# Patient Record
Sex: Female | Born: 1975 | Race: Black or African American | Hispanic: No | Smoking: Never smoker
Health system: Southern US, Community
[De-identification: ages and names within clinical notes are randomized; demographics above are authoritative.]

## PROBLEM LIST (undated history)

## (undated) DIAGNOSIS — E119 Type 2 diabetes mellitus without complications: Secondary | ICD-10-CM

## (undated) DIAGNOSIS — J45909 Unspecified asthma, uncomplicated: Secondary | ICD-10-CM

## (undated) DIAGNOSIS — N289 Disorder of kidney and ureter, unspecified: Secondary | ICD-10-CM

## (undated) DIAGNOSIS — I10 Essential (primary) hypertension: Secondary | ICD-10-CM

## (undated) HISTORY — PX: ABDOMINAL HYSTERECTOMY: SHX81

## (undated) HISTORY — PX: BREAST SURGERY: SHX581

---

## 2014-07-15 ENCOUNTER — Encounter (HOSPITAL_COMMUNITY): Payer: Self-pay | Admitting: *Deleted

## 2014-07-15 ENCOUNTER — Emergency Department (HOSPITAL_COMMUNITY)
Admission: EM | Admit: 2014-07-15 | Discharge: 2014-07-15 | Disposition: A | Discharge status: Home / Self Care | Payer: Self-pay | Attending: Emergency Medicine | Admitting: Emergency Medicine

## 2014-07-15 DIAGNOSIS — T23201A Burn of second degree of right hand, unspecified site, initial encounter: Secondary | ICD-10-CM

## 2014-07-15 DIAGNOSIS — Y9289 Other specified places as the place of occurrence of the external cause: Secondary | ICD-10-CM | POA: Insufficient documentation

## 2014-07-15 DIAGNOSIS — X102XXA Contact with fats and cooking oils, initial encounter: Secondary | ICD-10-CM | POA: Insufficient documentation

## 2014-07-15 DIAGNOSIS — I10 Essential (primary) hypertension: Secondary | ICD-10-CM | POA: Insufficient documentation

## 2014-07-15 DIAGNOSIS — Y9389 Activity, other specified: Secondary | ICD-10-CM | POA: Insufficient documentation

## 2014-07-15 DIAGNOSIS — Y998 Other external cause status: Secondary | ICD-10-CM | POA: Insufficient documentation

## 2014-07-15 DIAGNOSIS — Z79899 Other long term (current) drug therapy: Secondary | ICD-10-CM | POA: Insufficient documentation

## 2014-07-15 DIAGNOSIS — Z7982 Long term (current) use of aspirin: Secondary | ICD-10-CM | POA: Insufficient documentation

## 2014-07-15 DIAGNOSIS — T23261A Burn of second degree of back of right hand, initial encounter: Secondary | ICD-10-CM | POA: Insufficient documentation

## 2014-07-15 DIAGNOSIS — J45909 Unspecified asthma, uncomplicated: Secondary | ICD-10-CM | POA: Insufficient documentation

## 2014-07-15 HISTORY — DX: Unspecified asthma, uncomplicated: J45.909

## 2014-07-15 HISTORY — DX: Essential (primary) hypertension: I10

## 2014-07-15 MED ORDER — SILVER SULFADIAZINE 1 % EX CREA
TOPICAL_CREAM | CUTANEOUS | Status: AC
  Administered 2014-07-15: 1 via TOPICAL
  Filled 2014-07-15: qty 85

## 2014-07-15 MED ORDER — IBUPROFEN 800 MG PO TABS
800.0000 mg | ORAL_TABLET | Freq: Once | ORAL | Status: AC
  Administered 2014-07-15: 800 mg via ORAL
  Filled 2014-07-15: qty 1

## 2014-07-15 MED ORDER — HYDROCODONE-ACETAMINOPHEN 5-325 MG PO TABS: 1.0000 | ORAL_TABLET | ORAL | Status: DC | PRN
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
## 2014-07-15 NOTE — Discharge Instructions (Signed)
Please follow the directions provided. Use resource guide or the referral given to establish care with a primary care doctor to ensure you're getting better. Be sure to return in 2 days for wound recheck. Change your dressing daily using the Silvadene cream every day. Don't hesitate to return for any new, worsening, or concerning symptoms.   SEEK IMMEDIATE MEDICAL CARE IF:  You think your burn might be infected. It may change color, become red, leak fluid, swell, or smell bad.  You develop a fever of more than 102 F (38.9 C).   Emergency Department Resource Guide 1) Find a Doctor and Pay Out of Pocket Although you won't have to find out who is covered by your insurance plan, it is a good idea to ask around and get recommendations. You will then need to call the office and see if the doctor you have chosen will accept you as a new patient and what types of options they offer for patients who are self-pay. Some doctors offer discounts or will set up payment plans for their patients who do not have insurance, but you will need to ask so you aren't surprised when you get to your appointment.  2) Contact Your Local Health Department Not all health departments have doctors that can see patients for sick visits, but many do, so it is worth a call to see if yours does. If you don't know where your local health department is, you can check in your phone book. The CDC also has a tool to help you locate your state's health department, and many state websites also have listings of all of their local health departments.  3) Find a Walk-in Clinic If your illness is not likely to be very severe or complicated, you may want to try a walk in clinic. These are popping up all over the country in pharmacies, drugstores, and shopping centers. They're usually staffed by nurse practitioners or physician assistants that have been trained to treat common illnesses and complaints. They're usually fairly quick and  inexpensive. However, if you have serious medical issues or chronic medical problems, these are probably not your best option.  No Primary Care Doctor: - Call Health Connect at  [phone] - they can help you locate a primary care doctor that  accepts your insurance, provides certain services, etc. - Physician Referral Service- [phone]  Chronic Pain Problems: Organization         Address  Phone   Notes  [facility]  [phone] Patients need to be referred by their primary care doctor.   Medication Assistance: Organization         Address  Phone   Notes  [facility] Medication [facility] [address] [place], [place] [address] [phone] --Must be a resident of [place] [place] -- Must have NO insurance coverage whatsoever (no Medicaid/ Medicare, etc.) -- The pt. MUST have a primary care doctor that directs their care regularly and follows them in the community   MedAssist  [phone]   [employer]  [phone]    Agencies that provide inexpensive medical care: Organization         Address  Phone   Notes  [facility]  [phone]   [facility]    [phone]   [facility] [address] [place], [place] [address] [phone]   [facility] [address], [place] [phone]  Planned Parenthood    [phone]   [facility]    [phone]   Community [facility] and [address], [place] Phone:  [phone], Fax:  [phone] Hours of Operation:  9 am - 6 pm, M-F.  Also accepts Medicaid/Medicare and self-pay.  [facility] for [place], [address], [place] Phone: [phone], Fax: [phone]. Hours of Operation:  8:30 am - 5:30 pm, M-F.  Also accepts Medicaid and self-pay.  [facility] [address] [place] Phone: [phone]   [place], [place], [place] [phone], Ext. [phone] Mondays & Thursdays: 7-9 AM.  First 15 patients are seen on a first come, first serve basis.    [place] Providers:  Organization         Address  Phone   Notes  [facility] [address], [place] [phone] Also accepts self-pay patients.  [facility] [address], [place]  [phone]   [place], [address], [place] [phone]   [facility] [address] [place] [phone]   Lucianne Lei [address], [place]   [phone] Only accepts [place] Access [place] patients after they have their name applied to their card.   Self-Pay (no insurance) in [facility]:  Organization         Address  Phone   Notes  Sickle Cell Patients, [facility] [place] [phone]   [facility] [place] [phone]   [facility] [place]  [place], [place], [place] [phone]   [facility]/Dr. Osei-Bonsu  [address] [place] or [address], [place] [phone] Phone number for both [place] and [place] locations is the same.  [facility] [address] [place] [phone]   [facility] [address] [place] or [address] [phone]   [facility] [address], [place] [phone], phone; [phone], fax Sees patients 1st and 3rd Saturday of every month.  Must not qualify for public or private insurance (i.e. Medicaid, Medicare, [place] Health Choice, Veterans' Benefits)  Household income should be no more than 200% of the poverty level The clinic cannot treat you if you are pregnant or  think you are pregnant  Sexually transmitted diseases are not treated at the clinic.    Dental Care: Organization         Address  Phone  Notes  [facility] of [facility] [phone] Accepts children up to age 21 who are enrolled in [place] or [place]; pregnant women with a Medicaid card; and children who have applied for Medicaid or [place] Health Choice, but were declined, whose parents can pay a reduced fee at time of service.  [facility] Department of [facility]  [address], [place] [phone] Accepts children up to age 38 who are enrolled in [place] or [place]; pregnant women with a Medicaid card; and children who have applied for Medicaid or [place], but were declined,  whose parents can pay a reduced fee at time of service.  [place] Adult [facility] Access PROGRAM  [place] [phone] Patients are seen by appointment only. Walk-ins are not accepted. [place] will see patients 41 years of age and older. Monday - Tuesday (8am-5pm) Most Wednesdays (8:30-5pm) $30 per visit, cash only  [facility] Access PROGRAM  [address], [facility] [phone] Patients are seen by appointment only. Walk-ins are not accepted. [place] will see patients 36 years of age and older. One Wednesday Evening (Monthly: Volunteer Based).  $30 per visit, cash only  [place]  [phone] for adults; Children under age 67, call Graduate Pediatric Dentistry at [phone]. Children aged 19-14, please call [phone] to request a pediatric application.  Dental services are provided in all areas of dental care including fillings, crowns and bridges, complete and partial dentures, implants, gum treatment, root canals, and extractions. Preventive care is also provided. Treatment is provided to both adults  and children. Patients are selected via a lottery and there is often a waiting list.   [facility] [address], [place]  [phone] [email or web address]   [facility] [address] [place], [place] [phone], Ext. [phone] Second and Fourth Thursday of each month, opens at 6:30 AM; Clinic ends at 9 AM.  Patients are seen on a first-come first-served basis, and a limited number are seen during each clinic.   [facility]  [address] Hillard Danker [place], [place] [phone]   Eligibility Requirements You must have lived in [place], [place], or [place] counties for at least the last three months.   You cannot be eligible for state or federal sponsored [employer], including [employer], [place], or [employer].   You generally cannot be eligible for healthcare insurance through your employer.    How to apply: Eligibility screenings are held every Tuesday and Wednesday afternoon from 1:00 pm until 4:00 pm. You do not need an appointment for the interview!  [facility] [address], [place], [place]   [place]  [place]  [place]  [phone]    Behavioral Health Resources in the Community: Intensive Outpatient Programs Organization         Address  Phone  Notes  [place]. [address], [place], [place] [phone]   [facility] Outpatient [address], [place], [place]   ADS: Alcohol & Drug Svcs [address], [place], [place]   [place] [address]  [place], [place] or [phone]   Substance Abuse Resources Organization         Address  Phone  Notes  Alcohol and Drug Services  [phone]   [place]  [phone]   [facility]     Chinita Pester  [phone]   Residential & Outpatient Substance Abuse Program  [phone]   Psychological Services Organization         Address  Phone  Notes  [facility] [place]  [place]  [phone]   [place] [address] [place] or [phone]    Mobile Crisis Teams Organization         Address  Phone  Notes  [facility],  Mobile Crisis Care Unit  [phone]   [facility]ive Psychotherapeutic Services  [address] [place], [place]   [facility] [address] [place] [phone]    Self-Help/Support Groups Organization         Address  Phone             Notes  [place] of [place] - variety of support groups  [place] Call for more information  Narcotics Anonymous (NA), [facility]aring [facility] [address], [employer] [place]  2 meetings at this location   Special educational needs teacher         Address  Phone  Notes  [facility] Residential Treatment [place],    [place]  [phone]   [facility]  [address] [place] [phone], [place], [place]   [place], [place] [phone] Admissions: 8am-3pm M-F  Incentives Substance [place] [address]    [place], [place] [phone]   [facility] [address] [place], [place], [place]   [facility] [address],  [place], [place]   [facility] Programs - Intensive Outpatient [address], [place], [place]   [facility] [place].,  [place], [place] [phone] or [phone]   [facility] Services (RTS) [address] [place], [place] Accepts Medicaid  [facility] [place] [address]  [place] [phone] Substance Abuse/Addiction Treatment   [facility] Organization         Address  Phone  Notes  [facility]  [phone]   Domenic Schwab, PhD [address] [place], [place]   [phone] or [phone]   [place], [place] [phone]   [facility] [address] [place], [place] [phone] Insurance/Medicaid/sponsorship through [facility] and Families [address] [place]                                    [place], [place] [phone] [place] [address], [place] [phone]    Dr. Adele Schilder  [phone]   [facility] Dept. 1) [address], [place] 2) [place] 3)  [address], [place] [phone]  [phone]   [place] [phone] or [phone] (After Hours)

## 2014-11-27 ENCOUNTER — Ambulatory Visit: Payer: Self-pay | Admitting: Internal Medicine

## 2015-02-12 ENCOUNTER — Encounter: Payer: Self-pay | Admitting: Internal Medicine

## 2015-02-12 ENCOUNTER — Ambulatory Visit (INDEPENDENT_AMBULATORY_CARE_PROVIDER_SITE_OTHER): Payer: Self-pay | Admitting: Internal Medicine

## 2015-02-12 VITALS — BP 160/106 | HR 93 | Temp 98.3°F | Ht 64.0 in | Wt 268.0 lb

## 2015-02-12 DIAGNOSIS — R000 Tachycardia, unspecified: Secondary | ICD-10-CM

## 2015-02-12 DIAGNOSIS — Z90710 Acquired absence of both cervix and uterus: Secondary | ICD-10-CM

## 2015-02-12 DIAGNOSIS — Z7951 Long term (current) use of inhaled steroids: Secondary | ICD-10-CM

## 2015-02-12 DIAGNOSIS — Z23 Encounter for immunization: Secondary | ICD-10-CM

## 2015-02-12 DIAGNOSIS — K219 Gastro-esophageal reflux disease without esophagitis: Secondary | ICD-10-CM

## 2015-02-12 DIAGNOSIS — D509 Iron deficiency anemia, unspecified: Secondary | ICD-10-CM

## 2015-02-12 DIAGNOSIS — J4520 Mild intermittent asthma, uncomplicated: Secondary | ICD-10-CM

## 2015-02-12 DIAGNOSIS — Z8639 Personal history of other endocrine, nutritional and metabolic disease: Secondary | ICD-10-CM

## 2015-02-12 DIAGNOSIS — K921 Melena: Secondary | ICD-10-CM

## 2015-02-12 DIAGNOSIS — F418 Other specified anxiety disorders: Secondary | ICD-10-CM

## 2015-02-12 DIAGNOSIS — J45909 Unspecified asthma, uncomplicated: Secondary | ICD-10-CM

## 2015-02-12 DIAGNOSIS — M5442 Lumbago with sciatica, left side: Secondary | ICD-10-CM

## 2015-02-12 DIAGNOSIS — M170 Bilateral primary osteoarthritis of knee: Secondary | ICD-10-CM

## 2015-02-12 DIAGNOSIS — R7303 Prediabetes: Secondary | ICD-10-CM

## 2015-02-12 DIAGNOSIS — F5089 Other specified eating disorder: Secondary | ICD-10-CM

## 2015-02-12 DIAGNOSIS — R7309 Other abnormal glucose: Secondary | ICD-10-CM

## 2015-02-12 DIAGNOSIS — M5441 Lumbago with sciatica, right side: Secondary | ICD-10-CM

## 2015-02-12 DIAGNOSIS — I10 Essential (primary) hypertension: Secondary | ICD-10-CM

## 2015-02-12 DIAGNOSIS — Z0000 Encounter for general adult medical examination without abnormal findings: Secondary | ICD-10-CM

## 2015-02-12 DIAGNOSIS — R198 Other specified symptoms and signs involving the digestive system and abdomen: Secondary | ICD-10-CM

## 2015-02-12 DIAGNOSIS — E559 Vitamin D deficiency, unspecified: Secondary | ICD-10-CM

## 2015-02-12 LAB — GLUCOSE, CAPILLARY: GLUCOSE-CAPILLARY: 110 mg/dL — AB (ref 65–99)

## 2015-02-12 LAB — POCT GLYCOSYLATED HEMOGLOBIN (HGB A1C): HEMOGLOBIN A1C: 6.4

## 2015-02-12 MED ORDER — PROPRANOLOL HCL 20 MG PO TABS: 20.0000 mg | ORAL_TABLET | Freq: Three times a day (TID) | ORAL | Status: DC

## 2015-02-12 MED ORDER — HYDROCHLOROTHIAZIDE 25 MG PO TABS: 25.0000 mg | ORAL_TABLET | Freq: Every day | ORAL | Status: DC

## 2015-02-12 MED ORDER — OMEPRAZOLE 40 MG PO CPDR: 40.0000 mg | DELAYED_RELEASE_CAPSULE | Freq: Every day | ORAL | Status: DC

## 2015-02-12 MED ORDER — ESCITALOPRAM OXALATE 10 MG PO TABS: 10.0000 mg | ORAL_TABLET | Freq: Every day | ORAL | Status: DC

## 2015-02-12 NOTE — Patient Instructions (Signed)
-   For next 2 weeks, taper your Effexor by taking a dose every other day. - Start Lexapro 10 mg daily tomorrow - Take Prilosec 40 mg daily  - Start Propanolol 20 mg daily - Please take your hydrochlorothiazide 25 mg daily - Follow up in 2 weeks  General Instructions:   Please bring your medicines with you each time you come to clinic.  Medicines may include prescription medications, over-the-counter medications, herbal remedies, eye drops, vitamins, or other pills.   Progress Toward Treatment Goals:  No flowsheet data found.  Self Care Goals & Plans:  No flowsheet data found.  No flowsheet data found.   Care Management & Community Referrals:  No flowsheet data found.

## 2015-02-13 LAB — HIV ANTIBODY (ROUTINE TESTING W REFLEX): HIV Screen 4th Generation wRfx: NONREACTIVE

## 2015-02-13 LAB — ANEMIA PROFILE B
BASOS ABS: 0 10*3/uL (ref 0.0–0.2)
Basos: 0 %
EOS (ABSOLUTE): 0.1 10*3/uL (ref 0.0–0.4)
Eos: 2 %
Ferritin: 9 ng/mL — ABNORMAL LOW (ref 15–150)
Folate: 3.8 ng/mL (ref >3.0)
Hematocrit: 34.3 % (ref 34.0–46.6)
Hemoglobin: 11.3 g/dL (ref 11.1–15.9)
IMMATURE GRANULOCYTES: 0 %
IRON SATURATION: 9 % — AB (ref 15–55)
IRON: 36 ug/dL (ref 27–159)
Immature Grans (Abs): 0 10*3/uL (ref 0.0–0.1)
Lymphocytes Absolute: 3 10*3/uL (ref 0.7–3.1)
Lymphs: 52 %
MCH: 26.7 pg (ref 26.6–33.0)
MCHC: 32.9 g/dL (ref 31.5–35.7)
MCV: 81 fL (ref 79–97)
MONOS ABS: 0.3 10*3/uL (ref 0.1–0.9)
Monocytes: 5 %
NEUTROS PCT: 41 %
Neutrophils Absolute: 2.4 10*3/uL (ref 1.4–7.0)
PLATELETS: 396 10*3/uL — AB (ref 150–379)
RBC: 4.23 x10E6/uL (ref 3.77–5.28)
RDW: 15.1 % (ref 12.3–15.4)
Retic Ct Pct: 1.2 % (ref 0.6–2.6)
Total Iron Binding Capacity: 384 ug/dL (ref 250–450)
UIBC: 348 ug/dL (ref 131–425)
VITAMIN B 12: 245 pg/mL (ref 211–946)
WBC: 5.8 10*3/uL (ref 3.4–10.8)

## 2015-02-13 LAB — CMP14 + ANION GAP
A/G RATIO: 1.7 (ref 1.1–2.5)
ALBUMIN: 4.2 g/dL (ref 3.5–5.5)
ALK PHOS: 78 IU/L (ref 39–117)
ALT: 7 IU/L (ref 0–32)
AST: 12 IU/L (ref 0–40)
Anion Gap: 18 mmol/L (ref 10.0–18.0)
BUN / CREAT RATIO: 14 (ref 9–23)
BUN: 10 mg/dL (ref 6–24)
Bilirubin Total: 0.3 mg/dL (ref 0.0–1.2)
CHLORIDE: 98 mmol/L (ref 97–106)
CO2: 22 mmol/L (ref 18–29)
Calcium: 8.9 mg/dL (ref 8.7–10.2)
Creatinine, Ser: 0.7 mg/dL (ref 0.57–1.00)
GFR calc non Af Amer: 109 mL/min/{1.73_m2} (ref >59)
GFR, EST AFRICAN AMERICAN: 125 mL/min/{1.73_m2} (ref >59)
GLUCOSE: 115 mg/dL — AB (ref 65–99)
Globulin, Total: 2.5 g/dL (ref 1.5–4.5)
POTASSIUM: 3.6 mmol/L (ref 3.5–5.2)
SODIUM: 138 mmol/L (ref 136–144)
TOTAL PROTEIN: 6.7 g/dL (ref 6.0–8.5)

## 2015-02-13 LAB — VITAMIN D 25 HYDROXY (VIT D DEFICIENCY, FRACTURES): Vit D, 25-Hydroxy: 12.7 ng/mL — ABNORMAL LOW (ref 30.0–100.0)

## 2015-02-13 LAB — TSH: TSH: 1.67 u[IU]/mL (ref 0.450–4.500)

## 2015-02-14 DIAGNOSIS — Z0000 Encounter for general adult medical examination without abnormal findings: Secondary | ICD-10-CM | POA: Insufficient documentation

## 2015-02-14 DIAGNOSIS — E559 Vitamin D deficiency, unspecified: Secondary | ICD-10-CM | POA: Insufficient documentation

## 2015-02-14 DIAGNOSIS — I10 Essential (primary) hypertension: Secondary | ICD-10-CM | POA: Insufficient documentation

## 2015-02-14 DIAGNOSIS — M5441 Lumbago with sciatica, right side: Secondary | ICD-10-CM | POA: Insufficient documentation

## 2015-02-14 DIAGNOSIS — D509 Iron deficiency anemia, unspecified: Secondary | ICD-10-CM | POA: Insufficient documentation

## 2015-02-14 DIAGNOSIS — R000 Tachycardia, unspecified: Secondary | ICD-10-CM | POA: Insufficient documentation

## 2015-02-14 DIAGNOSIS — F5083 Pica in adults: Secondary | ICD-10-CM | POA: Insufficient documentation

## 2015-02-14 DIAGNOSIS — K219 Gastro-esophageal reflux disease without esophagitis: Secondary | ICD-10-CM | POA: Insufficient documentation

## 2015-02-14 DIAGNOSIS — K921 Melena: Secondary | ICD-10-CM | POA: Insufficient documentation

## 2015-02-14 DIAGNOSIS — R7303 Prediabetes: Secondary | ICD-10-CM | POA: Insufficient documentation

## 2015-02-14 DIAGNOSIS — M170 Bilateral primary osteoarthritis of knee: Secondary | ICD-10-CM | POA: Insufficient documentation

## 2015-02-14 DIAGNOSIS — F5089 Other specified eating disorder: Secondary | ICD-10-CM | POA: Insufficient documentation

## 2015-02-14 DIAGNOSIS — M5442 Lumbago with sciatica, left side: Secondary | ICD-10-CM

## 2015-02-14 DIAGNOSIS — J45909 Unspecified asthma, uncomplicated: Secondary | ICD-10-CM | POA: Insufficient documentation

## 2015-02-14 DIAGNOSIS — F418 Other specified anxiety disorders: Secondary | ICD-10-CM | POA: Insufficient documentation

## 2015-02-14 DIAGNOSIS — R198 Other specified symptoms and signs involving the digestive system and abdomen: Secondary | ICD-10-CM | POA: Insufficient documentation

## 2015-02-14 MED ORDER — PREGABALIN 75 MG PO CAPS
75.0000 mg | ORAL_CAPSULE | Freq: Two times a day (BID) | ORAL | Status: DC
Start: 2015-02-14 — End: 2015-04-09

## 2015-02-14 MED ORDER — FERROUS SULFATE 325 (65 FE) MG PO TABS: 325.0000 mg | ORAL_TABLET | Freq: Every day | ORAL | Status: DC

## 2015-02-14 MED ORDER — ERGOCALCIFEROL 1.25 MG (50000 UT) PO CAPS: 50000.0000 [IU] | ORAL_CAPSULE | ORAL | Status: DC

## 2015-02-14 NOTE — Assessment & Plan Note (Signed)
Continue albuterol PRN. Continue to monitor symptoms.

## 2015-02-14 NOTE — Assessment & Plan Note (Signed)
Symptoms are not well controlled on Effexor. Additionally she has history of PTSD which responds better to SSRIs. Will switch to an SSRI and taper Effexor. - Instructed to taper Effexor by taking 1 pill every other day for 2 weeks - Instructed to start Lexapro 10 mg daily while tapering Effexor

## 2015-02-14 NOTE — Assessment & Plan Note (Addendum)
Patient reported her HR and symptoms were previously controlled with Propanolol. She was tachycardic at 106 today. - Start Propanolol 20 mg daily - Evaluate HR and symptoms at next visit

## 2015-02-14 NOTE — Assessment & Plan Note (Signed)
Controlled with ibuprofen 

## 2015-02-14 NOTE — Assessment & Plan Note (Signed)
Previous hx of anemia and being on iron supplementation. She is currently not taking any iron. - Check anemia panel>> consistent with iron deficiency anemia - Start ferrous sulfate 325 mg daily - Will need referral to GI for colonoscopy since also having blood in stool. Denies history of heavy menstrual periods.
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
## 2015-02-14 NOTE — Assessment & Plan Note (Signed)
Elevated glucose of 180 on prior records. Will check HbA1c.  >> HbA1c 6.4 which is right on the border of diabetes diagnosis. Discussed implementing lifestyle modifications (increase exercise, healthy eating) and recheck HbA1c in 3 months.

## 2015-02-14 NOTE — Assessment & Plan Note (Signed)
Patient admitted that she has pica and that it has worsened over the last year. Data is very poor on successful pica treatment, but her current Effexor does not seem to be helping. 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 - Taper Effexor - Start Lexapro 10 mg daily - Encouraged to stop ingesting soap/tissue paper

## 2015-02-14 NOTE — Assessment & Plan Note (Signed)
Hx of vitamin D deficiency but did not complete supplementation.  - Check vitamin D level>> low at 12.7 - Start Ergocalciferol 50,000 units Qweekly - Check vitamin D level in 8 weeks

## 2015-02-14 NOTE — Assessment & Plan Note (Signed)
FOBT negative in office today. Concerning as she has iron deficiency anemia (anemia panel checked) as well. I wonder if her pica and ingestion of soap/tissue paper could be contributing.  - Will need referral to GI for colonoscopy - Started on ferrous sulfate 325 mg daily - Encouraged to stop ingesting soap/tissue paper. Hopefully switch to SSRI will help symptoms of anxiety which is ultimately driving her pica. Data on successful pica treatment is poor however.

## 2015-02-14 NOTE — Assessment & Plan Note (Signed)
Back pain previously controlled on Lyrica and Meloxicam. Will restart Lyrica 75 mg BID.

## 2015-02-14 NOTE — Assessment & Plan Note (Signed)
Symptoms started this year. Possible that her pica and ingestion of soap is contributing to this. She could also have IBS as alternating diarrhea and constipation is commonly seen. I am worried with her hx of iron deficiency anemia and frank blood in her stool that she could have a malignancy. FOBT was negative in office today.  - Check anemia panel>> consistent with iron deficiency anemia - Will need referral to GI for colonoscopy  - Started on ferrous sulfate 325 mg daily  - Encouraged to stop ingesting soap and tissue paper. Hopefully change in her psych meds from Effexor to SSRI will help. There is much data on the treatment of pica.

## 2015-02-14 NOTE — Assessment & Plan Note (Addendum)
-   Has hx of hysterectomy due to prolapsed uterus and bladder - Last mammogram in 2014, will need another one this year - Will need colonoscopy due to blood in stool and iron deficiency anemia  - Flu shot today

## 2015-02-14 NOTE — Assessment & Plan Note (Signed)
BP Readings from Last 3 Encounters:  02/12/15 160/106  07/15/14 130/86    Lab Results  Component Value Date   NA 138 02/12/2015   K 3.6 02/12/2015   CREATININE 0.70 02/12/2015    Assessment: Blood pressure control:  BP elevated today. Will restart medications.    Plan: Medications:  Restart HCTZ 25 mg daily and Propanolol 20 mg daily.  Other plans:  - BP recheck in 2 weeks - bmet today

## 2015-02-14 NOTE — Assessment & Plan Note (Signed)
Symptoms well controlled with Prilosec 40 mg daily. Additionally with her hx of pica I wonder if her reflux is related to that as well.  - Continue Prilosec

## 2015-02-15 NOTE — Progress Notes (Signed)
Internal Medicine Clinic Attending  Case discussed with Dr. Rivet soon after the resident saw the patient.  We reviewed the resident's history and exam and pertinent patient test results.  I agree with the assessment, diagnosis, and plan of care documented in the resident's note.  

## 2015-03-29 ENCOUNTER — Encounter: Payer: Self-pay | Admitting: Internal Medicine

## 2015-04-02 ENCOUNTER — Ambulatory Visit: Payer: Self-pay | Admitting: Internal Medicine

## 2015-04-09 ENCOUNTER — Ambulatory Visit (INDEPENDENT_AMBULATORY_CARE_PROVIDER_SITE_OTHER): Payer: Self-pay | Admitting: Internal Medicine

## 2015-04-09 ENCOUNTER — Encounter: Payer: Self-pay | Admitting: Internal Medicine

## 2015-04-09 VITALS — BP 176/104 | HR 67 | Temp 98.4°F | Ht 62.0 in | Wt 272.5 lb

## 2015-04-09 DIAGNOSIS — G8929 Other chronic pain: Secondary | ICD-10-CM

## 2015-04-09 DIAGNOSIS — Z79899 Other long term (current) drug therapy: Secondary | ICD-10-CM

## 2015-04-09 DIAGNOSIS — R000 Tachycardia, unspecified: Secondary | ICD-10-CM

## 2015-04-09 DIAGNOSIS — D509 Iron deficiency anemia, unspecified: Secondary | ICD-10-CM

## 2015-04-09 DIAGNOSIS — M5441 Lumbago with sciatica, right side: Secondary | ICD-10-CM

## 2015-04-09 DIAGNOSIS — F418 Other specified anxiety disorders: Secondary | ICD-10-CM

## 2015-04-09 DIAGNOSIS — Z6841 Body Mass Index (BMI) 40.0 and over, adult: Secondary | ICD-10-CM

## 2015-04-09 DIAGNOSIS — M25561 Pain in right knee: Secondary | ICD-10-CM

## 2015-04-09 DIAGNOSIS — M25562 Pain in left knee: Secondary | ICD-10-CM

## 2015-04-09 DIAGNOSIS — E559 Vitamin D deficiency, unspecified: Secondary | ICD-10-CM

## 2015-04-09 DIAGNOSIS — M5442 Lumbago with sciatica, left side: Secondary | ICD-10-CM

## 2015-04-09 DIAGNOSIS — I10 Essential (primary) hypertension: Secondary | ICD-10-CM

## 2015-04-09 DIAGNOSIS — Z8639 Personal history of other endocrine, nutritional and metabolic disease: Secondary | ICD-10-CM

## 2015-04-09 MED ORDER — TRAMADOL HCL 50 MG PO TABS: 50.0000 mg | ORAL_TABLET | Freq: Four times a day (QID) | ORAL | Status: DC | PRN

## 2015-04-09 MED ORDER — ERGOCALCIFEROL 1.25 MG (50000 UT) PO CAPS: 50000.0000 [IU] | ORAL_CAPSULE | ORAL | Status: DC

## 2015-04-09 MED ORDER — LISINOPRIL 20 MG PO TABS: 20.0000 mg | ORAL_TABLET | Freq: Every day | ORAL | Status: AC

## 2015-04-09 NOTE — Progress Notes (Signed)
Patient ID: Vickie Peters, female   DOB: 06/28/1975, 39 y.o.   MRN: [id number] [facility] Subjective:   Patient ID: Vickie Peters female   DOB: 1976-01-26 39 y.o.   MRN: [id number]  HPI: Vickie Peters is a 39 y.o. female with hypertension, depression, anxiety, and posttraumatic stress disorder, inappropriate sinus tachycardia, chronic lower back pain, iron deficiency anemia, and gastroesophageal reflux disease resenting to clinic for follow-up.  Please see the assessment and plan for the status of the patient's chronic medical problems.  Past Medical History  Diagnosis Date  . Hypertension   . Asthma    Current Outpatient Prescriptions  Medication Sig Dispense Refill  . ergocalciferol (VITAMIN D2) 50000 UNITS capsule Take 1 capsule (50,000 Units total) by mouth once a week. 8 capsule 0  . escitalopram (LEXAPRO) 10 MG tablet Take 1 tablet (10 mg total) by mouth daily. 30 tablet 3  . ferrous sulfate 325 (65 FE) MG tablet Take 1 tablet (325 mg total) by mouth daily. 30 tablet 3  . hydrochlorothiazide (HYDRODIURIL) 25 MG tablet Take 1 tablet (25 mg total) by mouth daily. 30 tablet 3  . ibuprofen (ADVIL,MOTRIN) 400 MG tablet Take 400 mg by mouth every 6 (six) hours as needed for fever or mild pain.    Marland Kitchen omeprazole (PRILOSEC) 40 MG capsule Take 1 capsule (40 mg total) by mouth daily. 30 capsule 3  . pregabalin (LYRICA) 75 MG capsule Take 1 capsule (75 mg total) by mouth 2 (two) times daily. 60 capsule 2  . propranolol (INDERAL) 20 MG tablet Take 1 tablet (20 mg total) by mouth 3 (three) times daily. 30 tablet 3   No current facility-administered medications for this visit.   No family history on file. Social History   Social History  . Marital Status: Divorced    Spouse Name: N/A  . Number of Children: N/A  . Years of Education: N/A   Social History Main Topics  . Smoking status: Never Smoker   . Smokeless tobacco: Not on file  . Alcohol Use: No  . Drug Use: No   . Sexual Activity: Yes    Birth Control/ Protection: None   Other Topics Concern  . Not on file   Social History Narrative   Review of Systems  Constitutional: Negative for fever, chills, weight loss and malaise/fatigue.  Respiratory: Negative for cough and shortness of breath.   Cardiovascular: Negative for chest pain, palpitations and leg swelling.  Gastrointestinal: Positive for blood in stool. Negative for heartburn, abdominal pain and diarrhea.  Genitourinary: Negative for dysuria and urgency.  Musculoskeletal: Positive for back pain. Negative for myalgias.  Skin: Negative for itching and rash.  Neurological: Negative for dizziness, loss of consciousness and headaches.  Psychiatric/Behavioral: Negative for depression, suicidal ideas and substance abuse. The patient is not nervous/anxious.    Objective:  Physical Exam: Filed Vitals:   04/09/15 1359 04/09/15 1400 04/09/15 1458  BP:  188/104 176/104  Pulse: 69 67 67  Temp: 98.4 F (36.9 C)    TempSrc: Oral    Height:  (1.575 m)    Weight: 272 lb 8 oz (123.605 kg)    SpO2: 100%      General: obese black lady resting in bed comfortably, somewhat depressed affect, but appropriately conversational HEENT: no scleral icterus, extra-ocular muscles intact, oropharynx without lesions Cardiac: regular rate and rhythm, no rubs, murmurs or gallops Pulm: breathing well, clear to auscultation bilaterally Abd: bowel sounds normal, soft, nondistended, non-tender Ext: warm  and well perfused, without pedal edema Lymph: no cervical or supraclavicular lymphadenopathy Skin: no rash, hair, or nail changes Neuro: alert and oriented X3, cranial nerves II-XII grossly intact, moving all extremities well  Assessment & Plan:  Case discussed with Vickie Peters  Essential hypertension, benign She was quite hypertensive today to 190/110. She had not been taking her thiazide because it was making her urinate so much. She would like to try  something else, so I started her on lisinopril 20 mg daily as she is prediabetic. She'll come back in 4 weeks for a blood pressure recheck.  Inappropriate sinus tachycardia (HCC) Her pulse was much better today at 70 compared to 110 last month on propranolol 20 mg daily. She is happy taking the propranolol, and denies any symptoms of orthostatic hypotension.  Depression with anxiety She is very pleased with the effects of the Lexapro; she tells me she feels very "even" and this is the best medication she has been on. She successfully tapered her effects or without any problems.  Low back pain with bilateral sciatica She continues to complain of nagging lower back pain with shooting pains in her bilateral hips that comes and goes. Unfortunately, she was unable to afford the pregabalin prescribed at last visit. She tells me tramadol has helped her significantly in the past; she would only take it at night and at the lowest dose. I've prescribed her 20 pills of tramadol without refills today to get her through the holidays. Her new insurance kicks in on January 1, so hopefully pregabalin will be covered. If it's not, gabapentin may be another option for her. I wonder if stopping the Effexor or may be unmasking her chronic pain, but the benefits of the Lexapro seemed to outweigh that of Effexor.  Iron deficiency anemia I was going to refer her for colonoscopy per Vickie Peters's last note, but unfortunately she does not yet have insurance. We'll need to place this referral sometime after January 1. She's been doing well with the oral iron, and denies any symptomatic anemia or new blood in her stool.  Severe obesity (BMI >= 40) (HCC) I think her morbid obesity is very strongly correlated to her chronic lower back and bilateral knee pain. She is very motivated to lose weight, but feels she has had a dead end. She wants to see a bariatric surgeon, so I gave her information for the orientation seminar today. At  the next visit, I would like to propose the idea of talking to Vickie Peters for her expert health coaching.  Vitamin D deficiency I refilled her vitamin D prescription today.    Medications Ordered Meds ordered this encounter  Medications  . traMADol (ULTRAM) 50 MG tablet    Sig: Take 1 tablet (50 mg total) by mouth every 6 (six) hours as needed.    Dispense:  20 tablet    Refill:  0  . lisinopril (PRINIVIL,ZESTRIL) 20 MG tablet    Sig: Take 1 tablet (20 mg total) by mouth daily.    Dispense:  30 tablet    Refill:  11  . ergocalciferol (VITAMIN D2) 50000 UNITS capsule    Sig: Take 1 capsule (50,000 Units total) by mouth once a week.    Dispense:  8 capsule    Refill:  0   Other Orders No orders of the defined types were placed in this encounter.   Follow Up: Return in about 4 weeks (around 05/07/2015) for blood pressure re-check.

## 2015-04-09 NOTE — Assessment & Plan Note (Signed)
She was quite hypertensive today to 190/110. She had not been taking her thiazide because it was making her urinate so much. She would like to try something else, so I started her on lisinopril 20 mg daily as she is prediabetic. She'll come back in 4 weeks for a blood pressure recheck.

## 2015-04-09 NOTE — Patient Instructions (Addendum)
Miss Brown,  It was a pleasure to meet yManson Passeyou today!  For your lower back pain, I prescribed you tramadol to get you through holidays until your new insurance kicks in, and that hopefully we can get you back on the Lyrica as that had helped you so much in the past. If the Lyrica still too expensive, we can always try gabapentin. You seem very motivated to lose weight; I'm confident that losing the weight will help with your back and knee pain as well. I've given you the phone number for the bariatric seminar, and I placed a referral for physical therapy. Please let us know if there is anything else we can do to help you. We have a great dietitian here in the clinic name Norm ParcelDonna Plyler who would be more than happy to help you out as well.  I've also referred you for a colonoscopy because in no your mom had polyps, and you've had low blood counts. they'll be in touch with you.   For your high blood pressure, we stopped the thiazide because it was making you pee so much. I'm sorry about that. Instead, we started lisinopril; like I told you, this can cause a cough, so call us if it is, and we can switch her to another class of medication.  Take care, and have a Merry Christmas, Dr. Earnest ConroyFlores

## 2015-04-09 NOTE — Assessment & Plan Note (Signed)
Her pulse was much better today at 70 compared to 110 last month on propranolol 20 mg daily. She is happy taking the propranolol, and denies any symptoms of orthostatic hypotension.

## 2015-04-09 NOTE — Assessment & Plan Note (Signed)
I refilled her vitamin D prescription today.

## 2015-04-09 NOTE — Assessment & Plan Note (Signed)
I think her morbid obesity is very strongly correlated to her chronic lower back and bilateral knee pain. She is very motivated to lose weight, but feels she has had a dead end. She wants to see a bariatric surgeon, so I gave her information for the orientation seminar today. At the next visit, I would like to propose the idea of talking to Ms. Vickie Peters for her expert health coaching.

## 2015-04-09 NOTE — Progress Notes (Signed)
Internal Medicine Clinic Attending  Case discussed with Dr. Flores at the time of the visit.  We reviewed the resident's history and exam and pertinent patient test results.  I agree with the assessment, diagnosis, and plan of care documented in the resident's note. 

## 2015-04-09 NOTE — Assessment & Plan Note (Signed)
She is very pleased with the effects of the Lexapro; she tells me she feels very "even" and this is the best medication she has been on. She successfully tapered her effects or without any problems.

## 2015-04-09 NOTE — Assessment & Plan Note (Signed)
She continues to complain of nagging lower back pain with shooting pains in her bilateral hips that comes and goes. Unfortunately, she was unable to afford the pregabalin prescribed at last visit. She tells me tramadol has helped her significantly in the past; she would only take it at night and at the lowest dose. I've prescribed her 20 pills of tramadol without refills today to get her through the holidays. Her new insurance kicks in on January 1, so hopefully pregabalin will be covered. If it's not, gabapentin may be another option for her. I wonder if stopping the Effexor or may be unmasking her chronic pain, but the benefits of the Lexapro seemed to outweigh that of Effexor.

## 2015-04-09 NOTE — Assessment & Plan Note (Signed)
I was going to refer her for colonoscopy per Dr. Larena Soxivet's last note, but unfortunately she does not yet have insurance. We'll need to place this referral sometime after January 1. She's been doing well with the oral iron, and denies any symptomatic anemia or new blood in her stool.

## 2015-04-22 ENCOUNTER — Telehealth: Payer: Self-pay | Admitting: *Deleted

## 2015-04-22 ENCOUNTER — Encounter: Payer: Self-pay | Admitting: Internal Medicine

## 2015-04-22 ENCOUNTER — Ambulatory Visit (INDEPENDENT_AMBULATORY_CARE_PROVIDER_SITE_OTHER): Payer: Self-pay | Admitting: Internal Medicine

## 2015-04-22 VITALS — BP 156/91 | HR 75 | Temp 98.3°F | Ht 62.0 in | Wt 273.9 lb

## 2015-04-22 DIAGNOSIS — L259 Unspecified contact dermatitis, unspecified cause: Secondary | ICD-10-CM

## 2015-04-22 DIAGNOSIS — M5442 Lumbago with sciatica, left side: Secondary | ICD-10-CM

## 2015-04-22 DIAGNOSIS — G8929 Other chronic pain: Secondary | ICD-10-CM

## 2015-04-22 DIAGNOSIS — M5441 Lumbago with sciatica, right side: Secondary | ICD-10-CM

## 2015-04-22 MED ORDER — BETAMETHASONE DIPROPIONATE 0.05 % EX OINT: TOPICAL_OINTMENT | Freq: Two times a day (BID) | CUTANEOUS | Status: DC

## 2015-04-22 MED ORDER — MELOXICAM 7.5 MG PO TABS
7.5000 mg | ORAL_TABLET | Freq: Every day | ORAL | Status: DC
Start: 2015-04-22 — End: 2016-09-29

## 2015-04-22 NOTE — Patient Instructions (Signed)
1. You have hospital follow up appointments as follows:   2. Please take all medications as prescribed.  3. If you have worsening of your symptoms or new symptoms arise, please call the clinic ([phone]), or go to the ER immediately if symptoms are severe.     

## 2015-04-22 NOTE — Progress Notes (Signed)
Subjective:    Patient ID: Vickie Peters, female    DOB: 11-Sep-1975, 39 y.o.   MRN: [id number]  HPI Comments: Vickie Peters is a 39 year old woman with PMH as below here with acute c/o rash on her back.    Rash This is a new problem. The current episode started in the past 7 days (1 week ago). The problem has been gradually worsening (spreading) since onset. The affected locations include the back. The rash is characterized by dryness. She was exposed to a new medication and chemicals (admits to using an excessive amount of [facility] recently for her chronic low back pain.). Associated symptoms include diarrhea and fatigue. Pertinent negatives include no anorexia, facial edema, fever, shortness of breath or vomiting. (Intermittent diarrhea for years) Past treatments include moisturizer. The treatment provided no relief. Her past medical history is significant for allergies, asthma and varicella. There is no history of eczema. (Seasonal allergy, asthma is well controlled)     Past Medical History  Diagnosis Date  . Hypertension   . Asthma    Current Outpatient Prescriptions on File Prior to Visit  Medication Sig Dispense Refill  . ergocalciferol (VITAMIN D2) 50000 UNITS capsule Take 1 capsule (50,000 Units total) by mouth once a week. 8 capsule 0  . escitalopram (LEXAPRO) 10 MG tablet Take 1 tablet (10 mg total) by mouth daily. 30 tablet 3  . ferrous sulfate 325 (65 FE) MG tablet Take 1 tablet (325 mg total) by mouth daily. 30 tablet 3  . ibuprofen (ADVIL,MOTRIN) 400 MG tablet Take 400 mg by mouth every 6 (six) hours as needed for fever or mild pain.    Marland Kitchen lisinopril (PRINIVIL,ZESTRIL) 20 MG tablet Take 1 tablet (20 mg total) by mouth daily. 30 tablet 11  . omeprazole (PRILOSEC) 40 MG capsule Take 1 capsule (40 mg total) by mouth daily. 30 capsule 3  . propranolol (INDERAL) 20 MG tablet Take 1 tablet (20 mg total) by mouth 3 (three) times daily. 30 tablet 3  . traMADol (ULTRAM) 50 MG tablet Take 1  tablet (50 mg total) by mouth every 6 (six) hours as needed. 20 tablet 0   No current facility-administered medications on file prior to visit.    Review of Systems  Constitutional: Positive for fatigue. Negative for fever.  Respiratory: Negative for shortness of breath.   Gastrointestinal: Positive for diarrhea. Negative for vomiting, blood in stool and anorexia.  Genitourinary: Negative for hematuria and difficulty urinating.  Musculoskeletal: Positive for back pain.       Chronic low back pain.  Skin: Positive for rash.  Neurological: Negative for syncope and headaches.       Denies loss of bowel or bladder control.  Denies saddle anesthesia.       Filed Vitals:   04/22/15 1454  BP: 156/91  Pulse: 75  Temp: 98.3 F (36.8 C)  TempSrc: Oral  Height:  (1.575 m)  Weight: 273 lb 14.4 oz (124.24 kg)  SpO2: 100%     Objective:   Physical Exam  Constitutional: She is oriented to person, place, and time. She appears well-developed. No distress.  HENT:  Head: Normocephalic and atraumatic.  Mouth/Throat: Oropharynx is clear and moist. No oropharyngeal exudate.  Eyes: Conjunctivae and EOM are normal. Pupils are equal, round, and reactive to light. Right eye exhibits no discharge. Left eye exhibits no discharge. No scleral icterus.  Neck: Neck supple.  Cardiovascular: Normal rate, regular rhythm and normal heart sounds.  Exam reveals  no gallop and no friction rub.   No murmur heard. Pulmonary/Chest: Effort normal and breath sounds normal. No respiratory distress. She has no wheezes. She has no rales.  Abdominal: Soft. Bowel sounds are normal. She exhibits no distension. There is no tenderness. There is no rebound and no guarding.  Musculoskeletal: Normal range of motion. She exhibits no edema or tenderness.  Neurological: She is alert and oriented to person, place, and time. No cranial nerve deficit.  Skin: Skin is warm and dry. Rash noted. She is not diaphoretic. No erythema.    Hyperkeratotic, dry rash on skin overlying the central lumbar area.  There is no erythema, lesions or skin breakdown.  Psychiatric: She has a normal mood and affect. Her behavior is normal. Judgment and thought content normal.  Vitals reviewed.         Assessment & Plan:  Please see problem based charting for A&P.

## 2015-04-22 NOTE — Telephone Encounter (Signed)
Pt sends email to front desk stating icy hot is not working and she has developed a rash on her lower back from use of the icy hot, she also states that tramadol is not working. appt is made for this pm dr Andrey Campanilewilson

## 2015-04-23 DIAGNOSIS — L259 Unspecified contact dermatitis, unspecified cause: Secondary | ICD-10-CM | POA: Insufficient documentation

## 2015-04-23 NOTE — Assessment & Plan Note (Signed)
Assessment:  Hyperkeratotic, dry patch of skin on low back where she had been using excessive Icy Hot recently due to acute on chronic low back pain.  She has not had a problem [facility] in [facility] past but admits to using a lot more than usual recently.  She has not used in several days due to the rash.  It is not itchy but hurts a little.  Appearance and hx c/w contact dermatitis.  She also has asthma hx and daughter with eczema so eczema possible but, given the clinical picture seems more c/w contact dermatitis. Plan:  [facility] for now.  Rx provided for betamethasone ointment 0.05% to apply BID for no more than 2-3 weeks.  Advised that prolonged topical steroid can cause skin atrophy.  She will RTC if rash persists or worsens despite trx.

## 2015-04-23 NOTE — Assessment & Plan Note (Addendum)
Assessment:  She has had worsening of chronic back pain but no red flag symptoms.  [facility] has stopped [facility] due to dermatitis from excess use.  She says Mobic has worked the best for her in the past.  She also found relief with steroid injections in the past.  Plan:  Rx provided for Mobic 7.5mg  daily.  She was also advised she can alternate with Tylenol 500mg  qid prn if needed.  PCP has already placed referral to PT (her insurance with start on Jan 1).  I placed referral to Sports Medicine for possible steroid injection.   To ED with red flag symptoms.

## 2015-04-25 NOTE — Progress Notes (Signed)
Case discussed with Dr. Wilson soon after the resident saw the patient. We reviewed the resident's history and exam and pertinent patient test results. I agree with the assessment, diagnosis, and plan of care documented in the resident's note. 

## 2015-05-01 ENCOUNTER — Other Ambulatory Visit: Payer: Self-pay | Admitting: Internal Medicine

## 2015-05-04 ENCOUNTER — Encounter: Payer: Self-pay | Admitting: Gastroenterology

## 2015-05-13 ENCOUNTER — Encounter: Payer: Self-pay | Admitting: *Deleted

## 2015-05-17 ENCOUNTER — Encounter: Payer: Self-pay | Admitting: Internal Medicine

## 2015-06-01 ENCOUNTER — Encounter: Payer: Self-pay | Admitting: Internal Medicine

## 2015-06-01 ENCOUNTER — Other Ambulatory Visit: Payer: Self-pay | Admitting: Internal Medicine

## 2015-06-03 ENCOUNTER — Ambulatory Visit: Payer: Self-pay | Admitting: Internal Medicine

## 2015-06-06 ENCOUNTER — Emergency Department (HOSPITAL_COMMUNITY)
Admission: EM | Admit: 2015-06-06 | Discharge: 2015-06-06 | Disposition: A | Discharge status: Home / Self Care | Payer: Managed Care, Other (non HMO) | Attending: Emergency Medicine | Admitting: Emergency Medicine

## 2015-06-06 ENCOUNTER — Encounter (HOSPITAL_COMMUNITY): Payer: Self-pay

## 2015-06-06 DIAGNOSIS — R109 Unspecified abdominal pain: Secondary | ICD-10-CM | POA: Diagnosis not present

## 2015-06-06 DIAGNOSIS — K625 Hemorrhage of anus and rectum: Secondary | ICD-10-CM | POA: Insufficient documentation

## 2015-06-06 DIAGNOSIS — I10 Essential (primary) hypertension: Secondary | ICD-10-CM | POA: Insufficient documentation

## 2015-06-06 DIAGNOSIS — J45909 Unspecified asthma, uncomplicated: Secondary | ICD-10-CM | POA: Insufficient documentation

## 2015-06-06 LAB — COMPREHENSIVE METABOLIC PANEL
ALBUMIN: 3.3 g/dL — AB (ref 3.5–5.0)
ALK PHOS: 72 U/L (ref 38–126)
ALT: 9 U/L — AB (ref 14–54)
ANION GAP: 11 (ref 5–15)
AST: 14 U/L — ABNORMAL LOW (ref 15–41)
BUN: 8 mg/dL (ref 6–20)
CALCIUM: 9.2 mg/dL (ref 8.9–10.3)
CO2: 24 mmol/L (ref 22–32)
CREATININE: 0.75 mg/dL (ref 0.44–1.00)
Chloride: 105 mmol/L (ref 101–111)
GFR calc non Af Amer: >60 mL/min (ref >60)
GLUCOSE: 211 mg/dL — AB (ref 65–99)
Potassium: 4.3 mmol/L (ref 3.5–5.1)
SODIUM: 140 mmol/L (ref 135–145)
TOTAL PROTEIN: 6.8 g/dL (ref 6.5–8.1)

## 2015-06-06 LAB — TYPE AND SCREEN
ABO/RH(D): O POS
ANTIBODY SCREEN: NEGATIVE

## 2015-06-06 LAB — CBC
HCT: 37.6 % (ref 36.0–46.0)
Hemoglobin: 12.1 g/dL (ref 12.0–15.0)
MCH: 27.3 pg (ref 26.0–34.0)
MCHC: 32.2 g/dL (ref 30.0–36.0)
MCV: 84.9 fL (ref 78.0–100.0)
PLATELETS: 341 10*3/uL (ref 150–400)
RBC: 4.43 MIL/uL (ref 3.87–5.11)
RDW: 14.3 % (ref 11.5–15.5)
WBC: 6.3 10*3/uL (ref 4.0–10.5)

## 2015-06-06 LAB — ABO/RH: ABO/RH(D): O POS

## 2015-06-06 NOTE — ED Notes (Signed)
Pt reports  Onset 1 year of intermittant rectal bleeding, past 3 weeks bleeding is daily.  Pt has a colonoscopy due on 06-22-15.  Onset 3 weeks pt reports a constant pain in abd wrapping around to back. Pt reports feels like a lumpy band of pain around abd to back.

## 2015-06-06 NOTE — ED Notes (Signed)
Called patient to move to exam room. Unable to locate at this time. 

## 2015-06-07 ENCOUNTER — Other Ambulatory Visit: Payer: Self-pay | Admitting: Internal Medicine

## 2015-06-14 NOTE — Addendum Note (Signed)
Addended by: Neomia Dear on: 06/14/2015 08:55 PM   Modules accepted: Orders

## 2015-06-22 ENCOUNTER — Ambulatory Visit: Payer: Self-pay | Admitting: Gastroenterology

## 2015-07-02 ENCOUNTER — Ambulatory Visit: Payer: Self-pay | Admitting: Internal Medicine

## 2015-07-02 ENCOUNTER — Encounter: Payer: Self-pay | Admitting: Internal Medicine

## 2015-07-02 ENCOUNTER — Other Ambulatory Visit: Payer: Self-pay | Admitting: Internal Medicine

## 2015-07-07 ENCOUNTER — Telehealth: Payer: Self-pay | Admitting: Internal Medicine

## 2015-07-07 NOTE — Telephone Encounter (Signed)
APPT. REMINDER CALL, LMTCB °

## 2015-07-08 ENCOUNTER — Ambulatory Visit: Payer: Self-pay

## 2015-07-23 ENCOUNTER — Telehealth: Payer: Self-pay | Admitting: Internal Medicine

## 2015-07-23 NOTE — Telephone Encounter (Signed)
APPT. REMINDER CALL, LMTCB °

## 2015-07-26 ENCOUNTER — Ambulatory Visit: Payer: Managed Care, Other (non HMO)

## 2015-08-03 ENCOUNTER — Ambulatory Visit: Payer: Managed Care, Other (non HMO)

## 2015-08-19 ENCOUNTER — Other Ambulatory Visit: Payer: Self-pay | Admitting: Internal Medicine

## 2015-08-20 NOTE — Telephone Encounter (Signed)
Last visit 03/2015, multiple missed/canceled appts, no upcoming appts scheduled, please advise.Kingsley SpittleGoldston, [name]4/28/20178:58 AM

## 2015-08-27 ENCOUNTER — Other Ambulatory Visit: Payer: Self-pay | Admitting: Internal Medicine

## 2015-08-30 NOTE — Telephone Encounter (Signed)
Patient not seen in clinic since 04/21/2016. Multiple no shows and cancellations. Letter sent to patient on 07/02/2015 requesting appointment for future refills. No future appointments.

## 2015-08-31 ENCOUNTER — Telehealth: Payer: Self-pay | Admitting: *Deleted

## 2015-08-31 NOTE — Telephone Encounter (Signed)
Patient was sent a mychart msg this am about scheduling a future appt.  Respond that she would make an appt, but she is currently out of two of her meds.  Forwarding message to triage .

## 2015-08-31 NOTE — Telephone Encounter (Signed)
Contacted patient to let her know that her Lexapro and Inderal had been filled and to try to make an appointment for her. Patient stated she did not want to make an appointment at this time due to not having insurance. Sates she is "getting together the papers for the orange card". Informed patient to call and make an appointment as soon as this was completed and that refills may not be made in the future without an appointment.

## 2015-09-01 ENCOUNTER — Other Ambulatory Visit: Payer: Self-pay | Admitting: Internal Medicine

## 2015-09-01 DIAGNOSIS — F418 Other specified anxiety disorders: Secondary | ICD-10-CM

## 2015-09-01 DIAGNOSIS — R000 Tachycardia, unspecified: Secondary | ICD-10-CM

## 2015-09-01 MED ORDER — ESCITALOPRAM OXALATE 10 MG PO TABS: 10.0000 mg | ORAL_TABLET | Freq: Every day | ORAL | Status: AC

## 2015-09-01 MED ORDER — PROPRANOLOL HCL 20 MG PO TABS: 20.0000 mg | ORAL_TABLET | Freq: Three times a day (TID) | ORAL | Status: AC

## 2015-12-11 ENCOUNTER — Other Ambulatory Visit: Payer: Self-pay | Admitting: Internal Medicine

## 2016-01-03 ENCOUNTER — Ambulatory Visit: Payer: Managed Care, Other (non HMO)

## 2016-01-05 ENCOUNTER — Ambulatory Visit: Payer: Managed Care, Other (non HMO)

## 2016-04-07 ENCOUNTER — Emergency Department (HOSPITAL_COMMUNITY)
Admission: EM | Admit: 2016-04-07 | Discharge: 2016-04-07 | Disposition: A | Discharge status: Home / Self Care | Payer: Managed Care, Other (non HMO) | Attending: Emergency Medicine | Admitting: Emergency Medicine

## 2016-04-07 ENCOUNTER — Emergency Department (HOSPITAL_COMMUNITY): Payer: Managed Care, Other (non HMO)

## 2016-04-07 ENCOUNTER — Encounter (HOSPITAL_COMMUNITY): Payer: Self-pay | Admitting: Emergency Medicine

## 2016-04-07 DIAGNOSIS — R0602 Shortness of breath: Secondary | ICD-10-CM | POA: Insufficient documentation

## 2016-04-07 DIAGNOSIS — Z5321 Procedure and treatment not carried out due to patient leaving prior to being seen by health care provider: Secondary | ICD-10-CM | POA: Insufficient documentation

## 2016-04-07 DIAGNOSIS — I10 Essential (primary) hypertension: Secondary | ICD-10-CM | POA: Insufficient documentation

## 2016-04-07 DIAGNOSIS — J45909 Unspecified asthma, uncomplicated: Secondary | ICD-10-CM | POA: Insufficient documentation

## 2016-04-07 DIAGNOSIS — M546 Pain in thoracic spine: Secondary | ICD-10-CM | POA: Insufficient documentation

## 2016-04-07 LAB — BASIC METABOLIC PANEL
ANION GAP: 10 (ref 5–15)
BUN: 10 mg/dL (ref 6–20)
CO2: 25 mmol/L (ref 22–32)
Calcium: 9.6 mg/dL (ref 8.9–10.3)
Chloride: 100 mmol/L — ABNORMAL LOW (ref 101–111)
Creatinine, Ser: 0.74 mg/dL (ref 0.44–1.00)
GFR calc Af Amer: >60 mL/min (ref >60)
Glucose, Bld: 197 mg/dL — ABNORMAL HIGH (ref 65–99)
POTASSIUM: 3.8 mmol/L (ref 3.5–5.1)
SODIUM: 135 mmol/L (ref 135–145)

## 2016-04-07 LAB — CBC
HEMATOCRIT: 40.9 % (ref 36.0–46.0)
HEMOGLOBIN: 13.8 g/dL (ref 12.0–15.0)
MCH: 28.5 pg (ref 26.0–34.0)
MCHC: 33.7 g/dL (ref 30.0–36.0)
MCV: 84.5 fL (ref 78.0–100.0)
Platelets: 369 10*3/uL (ref 150–400)
RBC: 4.84 MIL/uL (ref 3.87–5.11)
RDW: 13.6 % (ref 11.5–15.5)
WBC: 7.3 10*3/uL (ref 4.0–10.5)

## 2016-04-07 LAB — I-STAT TROPONIN, ED: TROPONIN I, POC: 0 ng/mL (ref 0.00–0.08)

## 2016-04-07 NOTE — ED Triage Notes (Signed)
C/o thoracic back pain and sob.  States she took Norvasc for the first time yesterday and felt her heart racing x 3 hours.

## 2016-04-07 NOTE — ED Notes (Signed)
No answer in WR x 2

## 2016-04-07 NOTE — ED Notes (Signed)
Called for pt X1

## 2016-07-05 ENCOUNTER — Other Ambulatory Visit: Payer: Self-pay | Admitting: Internal Medicine

## 2016-07-05 DIAGNOSIS — R000 Tachycardia, unspecified: Secondary | ICD-10-CM

## 2016-09-29 ENCOUNTER — Encounter (HOSPITAL_COMMUNITY): Payer: Self-pay

## 2016-09-29 ENCOUNTER — Emergency Department (HOSPITAL_COMMUNITY): Payer: Self-pay

## 2016-09-29 ENCOUNTER — Emergency Department (HOSPITAL_COMMUNITY)
Admission: EM | Admit: 2016-09-29 | Discharge: 2016-09-29 | Disposition: A | Discharge status: Home / Self Care | Payer: Self-pay | Attending: Emergency Medicine | Admitting: Emergency Medicine

## 2016-09-29 DIAGNOSIS — R109 Unspecified abdominal pain: Secondary | ICD-10-CM | POA: Insufficient documentation

## 2016-09-29 DIAGNOSIS — J45909 Unspecified asthma, uncomplicated: Secondary | ICD-10-CM | POA: Insufficient documentation

## 2016-09-29 DIAGNOSIS — I10 Essential (primary) hypertension: Secondary | ICD-10-CM | POA: Insufficient documentation

## 2016-09-29 DIAGNOSIS — E119 Type 2 diabetes mellitus without complications: Secondary | ICD-10-CM | POA: Insufficient documentation

## 2016-09-29 DIAGNOSIS — Z79899 Other long term (current) drug therapy: Secondary | ICD-10-CM | POA: Insufficient documentation

## 2016-09-29 HISTORY — DX: Type 2 diabetes mellitus without complications: E11.9

## 2016-09-29 HISTORY — DX: Disorder of kidney and ureter, unspecified: N28.9

## 2016-09-29 LAB — BASIC METABOLIC PANEL
Anion gap: 10 (ref 5–15)
BUN: 6 mg/dL (ref 6–20)
CHLORIDE: 104 mmol/L (ref 101–111)
CO2: 26 mmol/L (ref 22–32)
CREATININE: 0.9 mg/dL (ref 0.44–1.00)
Calcium: 9.1 mg/dL (ref 8.9–10.3)
GFR calc Af Amer: >60 mL/min (ref >60)
GFR calc non Af Amer: >60 mL/min (ref >60)
GLUCOSE: 124 mg/dL — AB (ref 65–99)
POTASSIUM: 3.6 mmol/L (ref 3.5–5.1)
SODIUM: 140 mmol/L (ref 135–145)

## 2016-09-29 LAB — URINALYSIS, ROUTINE W REFLEX MICROSCOPIC
Bilirubin Urine: NEGATIVE
GLUCOSE, UA: NEGATIVE mg/dL
HGB URINE DIPSTICK: NEGATIVE
KETONES UR: NEGATIVE mg/dL
Leukocytes, UA: NEGATIVE
Nitrite: NEGATIVE
PROTEIN: NEGATIVE mg/dL
Specific Gravity, Urine: 1.008 (ref 1.005–1.030)
pH: 5 (ref 5.0–8.0)

## 2016-09-29 LAB — CBC WITH DIFFERENTIAL/PLATELET
Basophils Absolute: 0 10*3/uL (ref 0.0–0.1)
Basophils Relative: 0 %
Eosinophils Absolute: 0.1 10*3/uL (ref 0.0–0.7)
Eosinophils Relative: 1 %
HCT: 33 % — ABNORMAL LOW (ref 36.0–46.0)
Hemoglobin: 11.1 g/dL — ABNORMAL LOW (ref 12.0–15.0)
LYMPHS ABS: 2.6 10*3/uL (ref 0.7–4.0)
LYMPHS PCT: 27 %
MCH: 28.9 pg (ref 26.0–34.0)
MCHC: 33.6 g/dL (ref 30.0–36.0)
MCV: 85.9 fL (ref 78.0–100.0)
MONO ABS: 0.5 10*3/uL (ref 0.1–1.0)
MONOS PCT: 5 %
Neutro Abs: 6.6 10*3/uL (ref 1.7–7.7)
Neutrophils Relative %: 67 %
PLATELETS: 314 10*3/uL (ref 150–400)
RBC: 3.84 MIL/uL — AB (ref 3.87–5.11)
RDW: 15.2 % (ref 11.5–15.5)
WBC: 9.8 10*3/uL (ref 4.0–10.5)

## 2016-09-29 LAB — I-STAT BETA HCG BLOOD, ED (MC, WL, AP ONLY): I-stat hCG, quantitative: <5 m[IU]/mL (ref <5)

## 2016-09-29 MED ORDER — METHOCARBAMOL 750 MG PO TABS: 750.0000 mg | ORAL_TABLET | Freq: Four times a day (QID) | ORAL | 0 refills | Status: AC

## 2016-09-29 MED ORDER — HYDROCODONE-ACETAMINOPHEN 5-325 MG PO TABS: 2.0000 | ORAL_TABLET | ORAL | 0 refills | Status: AC | PRN

## 2016-09-29 NOTE — ED Provider Notes (Signed)
[facility]-EMERGENCY DEPT Provider Note   CSN: [id number] Arrival date & time: 09/29/16  0748     History   Chief Complaint Chief Complaint  Patient presents with  . Flank Pain    HPI Vickie Peters is a 41 y.o. female.  41 year old female presents with five-day history of left flank and lower back pain which is atraumatic. Pain is been dull and persistent and worse with certain movements. No radiation down to her leg. No dysuria or hematuria. No rashes. No associated fever, vomiting, diarrhea. Does have a history of stage III kidney disease and has been medicating with Excedrin. That temporarily relieves her symptoms appear worse in the morning. Does have a prior history of back pain.      Past Medical History:  Diagnosis Date  . Asthma   . Diabetes mellitus without complication (HCC)   . Hypertension   . Renal disorder     Patient Active Problem List   Diagnosis Date Noted  . Contact dermatitis 04/23/2015  . Severe obesity (BMI >= 40) (HCC) 04/09/2015  . Essential hypertension, benign 02/14/2015  . Depression with anxiety 02/14/2015  . Asthma, chronic 02/14/2015  . Osteoarthritis of both knees 02/14/2015  . Low back pain with bilateral sciatica 02/14/2015  . GERD (gastroesophageal reflux disease) 02/14/2015  . Vitamin D deficiency 02/14/2015  . Iron deficiency anemia 02/14/2015  . Inappropriate sinus tachycardia 02/14/2015  . Preventative health care 02/14/2015  . Prediabetes 02/14/2015    Past Surgical History:  Procedure Laterality Date  . ABDOMINAL HYSTERECTOMY    . BREAST SURGERY     breast reduction    OB History    No data available       Home Medications    Prior to Admission medications   Medication Sig Start Date End Date Taking? Authorizing Provider  betamethasone dipropionate (DIPROLENE) 0.05 % ointment Apply topically 2 (two) times daily. 04/22/15   Yolanda MangesWilson, Alex M, DO  ergocalciferol (VITAMIN D2) 50000 UNITS capsule Take 1 capsule (50,000  Units total) by mouth once a week. 04/09/15   Selina CooleyFlores, Kyle, MD  escitalopram (LEXAPRO) 10 MG tablet Take 1 tablet (10 mg total) by mouth daily. 09/01/15   Selina CooleyFlores, Kyle, MD  ferrous sulfate 325 (65 FE) MG tablet TAKE 1 TABLET BY MOUTH EVERY DAY 06/07/15   Selina CooleyFlores, Kyle, MD  ibuprofen (ADVIL,MOTRIN) 400 MG tablet Take 400 mg by mouth every 6 (six) hours as needed for fever or mild pain.    [provider]  lisinopril (PRINIVIL,ZESTRIL) 20 MG tablet Take 1 tablet (20 mg total) by mouth daily. 04/09/15 04/08/16  Selina CooleyFlores, Kyle, MD  meloxicam (MOBIC) 7.5 MG tablet Take 1 tablet (7.5 mg total) by mouth daily. 04/22/15   Yolanda MangesWilson, Alex M, DO  omeprazole (PRILOSEC) 40 MG capsule TAKE ONE CAPSULE BY MOUTH EVERY DAY 06/03/15   Selina CooleyFlores, Kyle, MD  propranolol (INDERAL) 20 MG tablet Take 1 tablet (20 mg total) by mouth 3 (three) times daily. 09/01/15   Selina CooleyFlores, Kyle, MD    Family History Family History  Problem Relation Age of Onset  . Hypertension Mother   . Glaucoma Mother     Social History Social History  Substance Use Topics  . Smoking status: Never Smoker  . Smokeless tobacco: Never Used  . Alcohol use No     Comment: socially     Allergies   Icy hot   Review of Systems Review of Systems  All other systems reviewed and are negative.    Physical  Exam Updated Vital Signs BP (!) 132/91 (BP Location: Left Arm)   Pulse 88   Temp 97.5 F (36.4 C) (Oral)   Resp 16   Ht 1.575 m (5\' 2" )   Wt 114.8 kg (253 lb)   SpO2 99%   BMI 46.27 kg/m   Physical Exam  Constitutional: She is oriented to person, place, and time. She appears well-developed and well-nourished.  Non-toxic appearance. No distress.  HENT:  Head: Normocephalic and atraumatic.  Eyes: Conjunctivae, EOM and lids are normal. Pupils are equal, round, and reactive to light.  Neck: Normal range of motion. Neck supple. No tracheal deviation present. No thyroid mass present.  Cardiovascular: Normal rate, regular rhythm and  normal heart sounds.  Exam reveals no gallop.   No murmur heard. Pulmonary/Chest: Effort normal and breath sounds normal. No stridor. No respiratory distress. She has no decreased breath sounds. She has no wheezes. She has no rhonchi. She has no rales.  Abdominal: Soft. Normal appearance and bowel sounds are normal. She exhibits no distension. There is no tenderness. There is no rigidity, no rebound, no guarding and no CVA tenderness.  Musculoskeletal: Normal range of motion. She exhibits no edema or tenderness.       Back:  Neurological: She is alert and oriented to person, place, and time. She has normal strength. No cranial nerve deficit or sensory deficit. GCS eye subscore is 4. GCS verbal subscore is 5. GCS motor subscore is 6.  Skin: Skin is warm and dry. No abrasion and no rash noted.  Psychiatric: She has a normal mood and affect. Her speech is normal and behavior is normal.  Nursing note and vitals reviewed.    ED Treatments / Results  Labs (all labs ordered are listed, but only abnormal results are displayed) Labs Reviewed  URINALYSIS, ROUTINE W REFLEX MICROSCOPIC  BASIC METABOLIC PANEL  CBC WITH DIFFERENTIAL/PLATELET  I-STAT BETA HCG BLOOD, ED ([facility], [facility], [facility] ONLY)    EKG  EKG Interpretation None       Radiology No results found.  Procedures Procedures (including critical care time)  Medications Ordered in ED Medications - No data to display   Initial Impression / Assessment and Plan / ED Course  I have reviewed the triage vital signs and the nursing notes.  Pertinent labs & imaging results that were available during my care of the patient were reviewed by me and considered in my medical decision making (see chart for details).    Patient with reassuring workup here. Urinalysis negative for infection. CT without acute findings. We'll treat with muscle relaxants and analgesics and discharged home  Final Clinical Impressions(s) / ED Diagnoses   Final  diagnoses:  None    New Prescriptions New Prescriptions   No medications on file     Lorre Nick, MD 09/29/16 1141

## 2016-09-29 NOTE — ED Notes (Signed)
Pt reminded of need for urine specimen 

## 2016-09-29 NOTE — ED Notes (Signed)
Patient given cup ice water

## 2016-09-29 NOTE — ED Triage Notes (Signed)
Patient c/o left flank pain that radiates into the abdomen that has gotten progressively worse x 5 days. Patient states the flow of urine has decreased. Patient also c/o of a lump on th left lower back x 5 days.

## 2017-05-10 ENCOUNTER — Emergency Department (HOSPITAL_COMMUNITY)
Admission: EM | Admit: 2017-05-10 | Discharge: 2017-05-11 | Disposition: A | Discharge status: Home / Self Care | Payer: Self-pay | Attending: Emergency Medicine | Admitting: Emergency Medicine

## 2017-05-10 ENCOUNTER — Encounter (HOSPITAL_COMMUNITY): Payer: Self-pay | Admitting: Emergency Medicine

## 2017-05-10 DIAGNOSIS — Z5321 Procedure and treatment not carried out due to patient leaving prior to being seen by health care provider: Secondary | ICD-10-CM | POA: Insufficient documentation

## 2017-05-10 DIAGNOSIS — R739 Hyperglycemia, unspecified: Secondary | ICD-10-CM | POA: Insufficient documentation

## 2017-05-10 LAB — BASIC METABOLIC PANEL
Anion gap: 12 (ref 5–15)
BUN: 10 mg/dL (ref 6–20)
CHLORIDE: 99 mmol/L — AB (ref 101–111)
CO2: 24 mmol/L (ref 22–32)
Calcium: 9.9 mg/dL (ref 8.9–10.3)
Creatinine, Ser: 1.02 mg/dL — ABNORMAL HIGH (ref 0.44–1.00)
GFR calc non Af Amer: >60 mL/min (ref >60)
Glucose, Bld: 441 mg/dL — ABNORMAL HIGH (ref 65–99)
POTASSIUM: 3.9 mmol/L (ref 3.5–5.1)
SODIUM: 135 mmol/L (ref 135–145)

## 2017-05-10 LAB — URINALYSIS, ROUTINE W REFLEX MICROSCOPIC
BACTERIA UA: NONE SEEN
Bilirubin Urine: NEGATIVE
Hgb urine dipstick: NEGATIVE
KETONES UR: NEGATIVE mg/dL
Nitrite: NEGATIVE
PROTEIN: NEGATIVE mg/dL
Specific Gravity, Urine: 1.036 — ABNORMAL HIGH (ref 1.005–1.030)
pH: 6 (ref 5.0–8.0)

## 2017-05-10 LAB — CBC
HEMATOCRIT: 39.8 % (ref 36.0–46.0)
HEMOGLOBIN: 13.7 g/dL (ref 12.0–15.0)
MCH: 29 pg (ref 26.0–34.0)
MCHC: 34.4 g/dL (ref 30.0–36.0)
MCV: 84.3 fL (ref 78.0–100.0)
PLATELETS: 373 10*3/uL (ref 150–400)
RBC: 4.72 MIL/uL (ref 3.87–5.11)
RDW: 13.6 % (ref 11.5–15.5)
WBC: 7.2 10*3/uL (ref 4.0–10.5)

## 2017-05-10 LAB — CBG MONITORING, ED: Glucose-Capillary: 391 mg/dL — ABNORMAL HIGH (ref 65–99)

## 2017-05-10 NOTE — ED Triage Notes (Signed)
Patient c/o hyperglycemia since yesterday. Reports noncompliant with Metformin due to IBS.

## 2017-05-10 NOTE — ED Notes (Signed)
Per registration, patient reports she is leaving. ?

## 2017-07-09 ENCOUNTER — Ambulatory Visit: Payer: Self-pay | Admitting: Family Medicine

## 2017-10-04 ENCOUNTER — Other Ambulatory Visit: Payer: Self-pay | Admitting: Physician Assistant

## 2017-10-04 DIAGNOSIS — R42 Dizziness and giddiness: Secondary | ICD-10-CM

## 2017-10-04 DIAGNOSIS — R220 Localized swelling, mass and lump, head: Secondary | ICD-10-CM

## 2017-10-04 DIAGNOSIS — H539 Unspecified visual disturbance: Secondary | ICD-10-CM

## 2017-10-04 DIAGNOSIS — G4452 New daily persistent headache (NDPH): Secondary | ICD-10-CM

## 2017-10-05 ENCOUNTER — Ambulatory Visit
Admission: RE | Admit: 2017-10-05 | Discharge: 2017-10-05 | Disposition: A | Discharge status: Home / Self Care | Payer: 59 | Source: Ambulatory Visit | Attending: Physician Assistant | Admitting: Physician Assistant

## 2017-10-05 DIAGNOSIS — H539 Unspecified visual disturbance: Secondary | ICD-10-CM

## 2017-10-05 DIAGNOSIS — R220 Localized swelling, mass and lump, head: Secondary | ICD-10-CM

## 2017-10-05 DIAGNOSIS — R42 Dizziness and giddiness: Secondary | ICD-10-CM

## 2017-10-05 DIAGNOSIS — G4452 New daily persistent headache (NDPH): Secondary | ICD-10-CM

## 2017-10-05 MED ORDER — GADOBENATE DIMEGLUMINE 529 MG/ML IV SOLN
20.0000 mL | Freq: Once | INTRAVENOUS | Status: AC | PRN
  Administered 2017-10-05: 20 mL via INTRAVENOUS

## 2019-07-11 ENCOUNTER — Ambulatory Visit: Payer: Self-pay | Attending: Internal Medicine

## 2019-07-25 ENCOUNTER — Ambulatory Visit: Payer: Medicaid Other

## 2020-04-09 IMAGING — MR MR HEAD WO/W CM
13 series · 48 of 48 positions shown · IV contrast (20ml Multihance)
Comparison: None.

CLINICAL DATA: New daily persistent headache. Head swelling.
Lightheadedness. Visual changes.

EXAM:
MRI HEAD WITHOUT AND WITH CONTRAST
TECHNIQUE: Multiplanar, multiecho pulse sequences of the brain and surrounding
structures were obtained without and with intravenous contrast.
CONTRAST:  20mL MULTIHANCE GADOBENATE DIMEGLUMINE 529 MG/ML IV SOLN

[Series 2: t1_se_sag · sagittal · 5.0mm · 0.45mm/px · 1 of 24 slices shown]
[im 1/24]
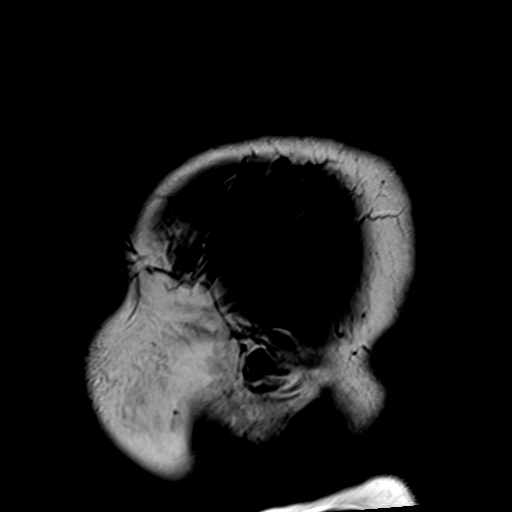

[Series 3: ep2d_diff_(id)_trace · axial · 3.0mm · 1.88mm/px · z∈[-44,+109]mm · 5 of 102 slices shown]
[im 1/102]
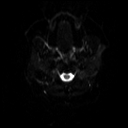
[im 26/102]
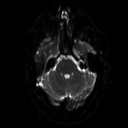
[im 51/102]
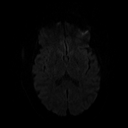
[im 76/102]
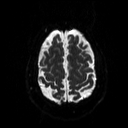
[im 102/102]
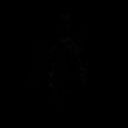

[Series 4: ep2d_diff_(id)_trace_adc · axial · 3.0mm · 1.88mm/px · z∈[-44,+109]mm · 2 of 50 slices shown]
[im 1/50]
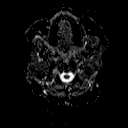
[im 50/50]
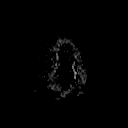

[Series 5: ep2d_diff_cor · coronal · 5.0mm · 1.77mm/px · 3 of 56 slices shown]
[im 1/56]
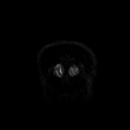
[im 28/56]
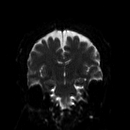
[im 56/56]
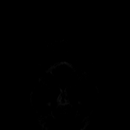

[Series 6: ep2d_diff_cor_adc · coronal · 5.0mm · 1.77mm/px · 2 of 29 slices shown]
[im 1/29]
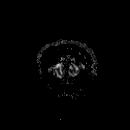
[im 29/29]
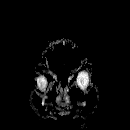

[Series 8: swi_images · axial · 2.0mm · 0.90mm/px · z∈[-46,+112]mm · 5 of 80 slices shown]
[im 1/80]
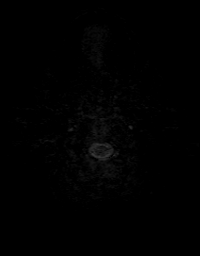
[im 20/80]
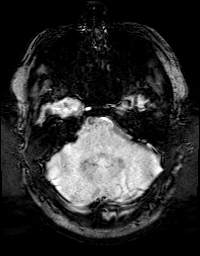
[im 40/80]
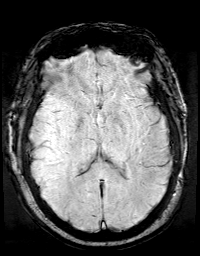
[im 60/80]
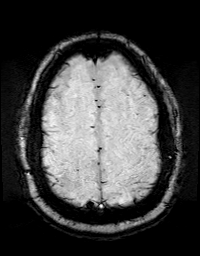
[im 80/80]
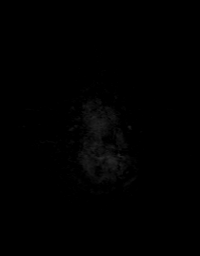

[Series 9: FLAIR · axial · 3.0mm · 0.43mm/px · z∈[-46,+110]mm · 2 of 27 slices shown (1 of 2)]
[im 1/27]
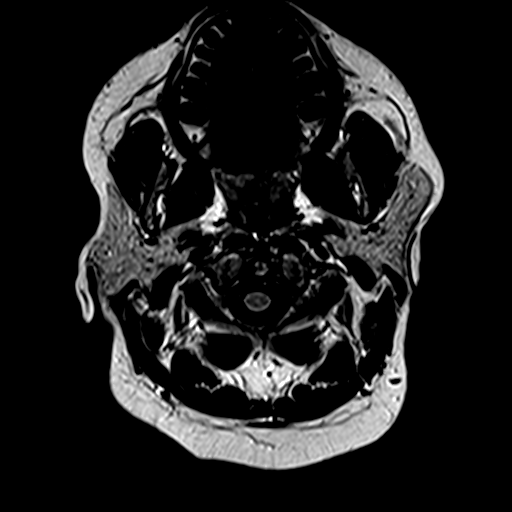
[im 27/27]
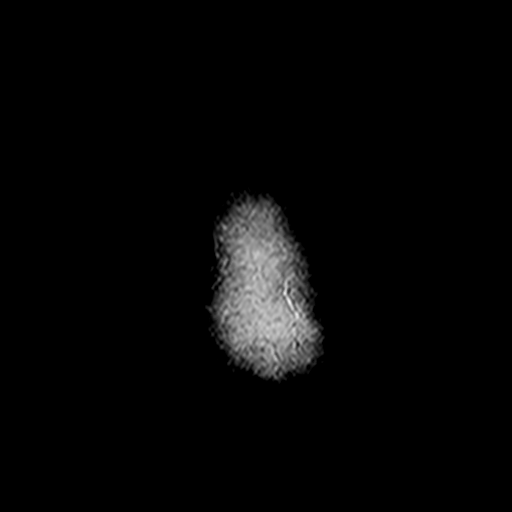

[Series 10: t2_tse_tra_512 · axial · 5.0mm · 0.62mm/px · z∈[-42,+108]mm · 2 of 26 slices shown]
[im 1/26]
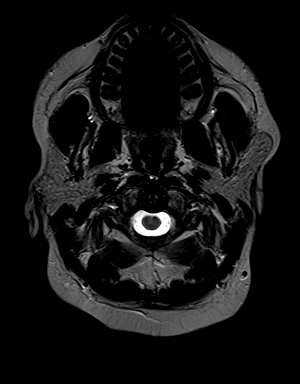
[im 26/26]
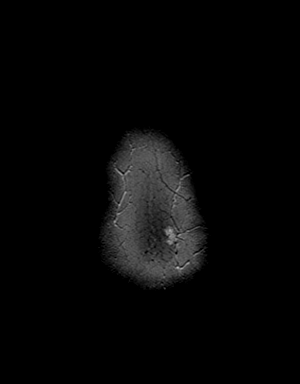

[Series 11: t1_mpr_tra · axial · 1.0mm · 0.72mm/px · z∈[-46,+112]mm · 10 of 160 slices shown]
[im 1/160]
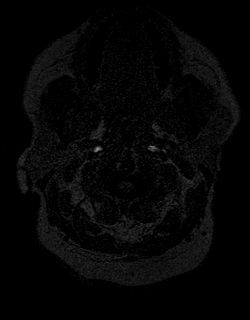
[im 18/160]
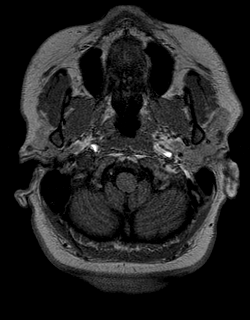
[im 36/160]
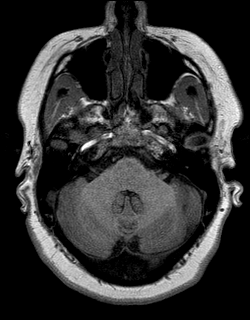
[im 54/160]
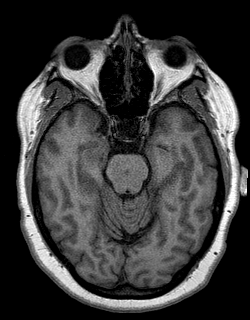
[im 71/160]
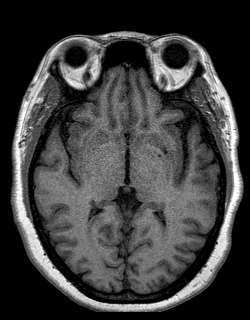
[im 89/160]
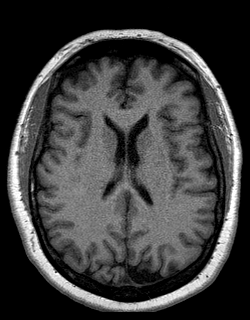
[im 107/160]
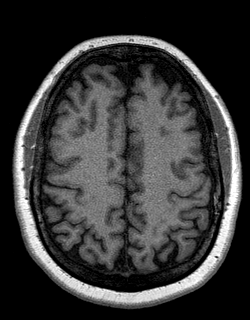
[im 124/160]
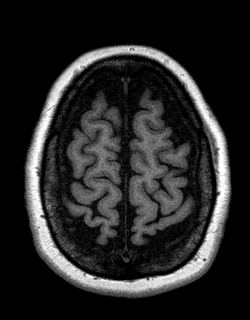
[im 142/160]
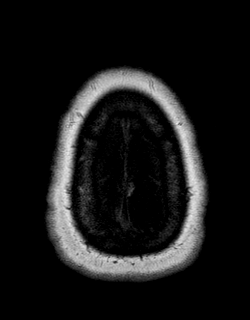
[im 160/160]
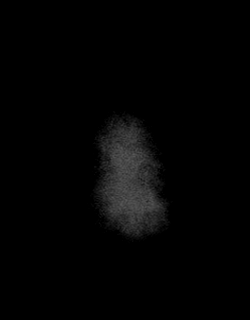

[Series 12: FLAIR · sagittal · 5.0mm · 0.45mm/px · 2 of 25 slices shown (2 of 2)]
[im 1/25]
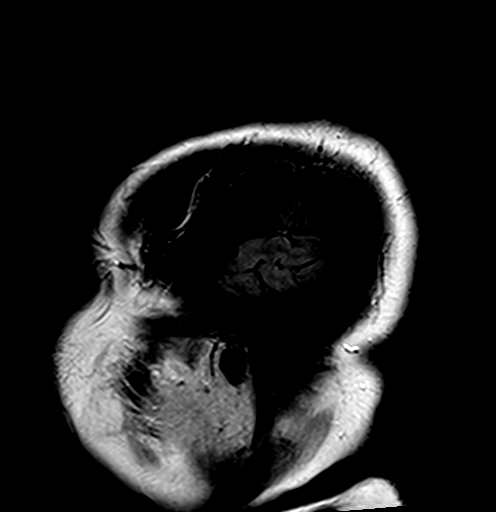
[im 25/25]
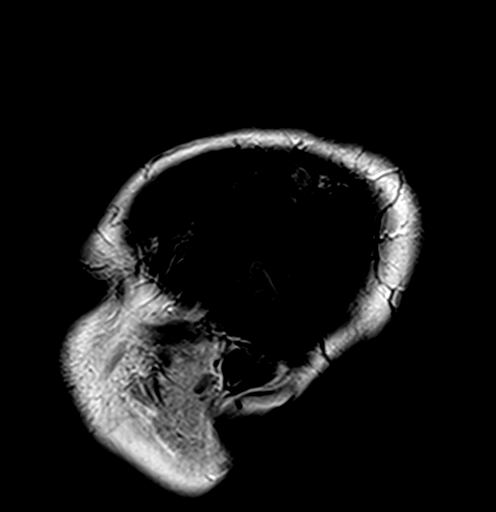

[Series 13: T2 · coronal · 5.0mm · 0.45mm/px · 2 of 29 slices shown]
[im 1/29]
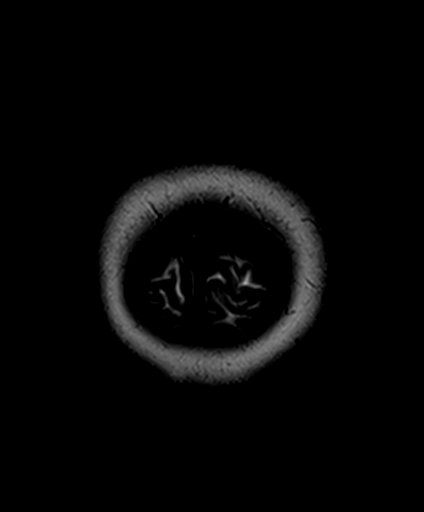
[im 29/29]
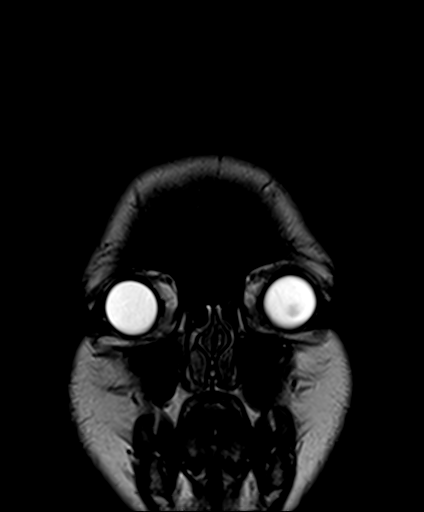

[Series 14: T1 post-contrast · coronal · 5.0mm · 0.72mm/px · 2 of 29 slices shown]
[im 1/29]
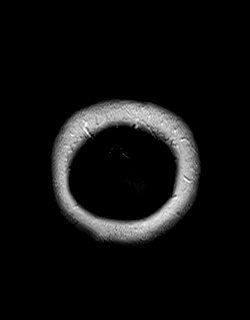
[im 29/29]
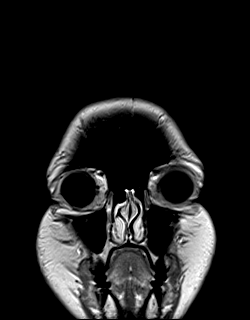

[Series 15: post t1_mpr_tra · axial · 1.0mm · 0.72mm/px · z∈[-46,+112]mm · 10 of 160 slices shown]
[im 1/160]
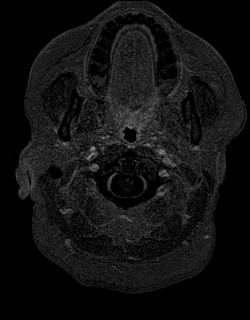
[im 18/160]
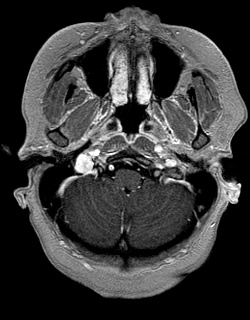
[im 36/160]
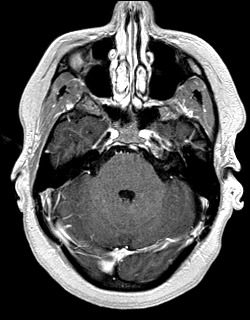
[im 54/160]
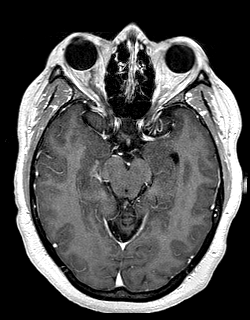
[im 71/160]
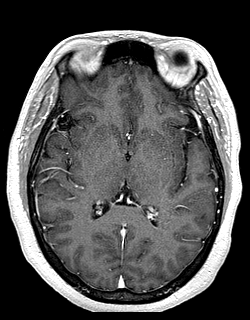
[im 89/160]
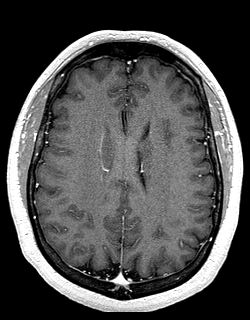
[im 107/160]
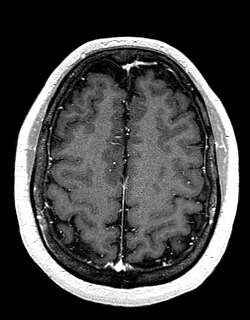
[im 124/160]
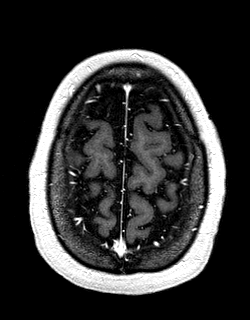
[im 142/160]
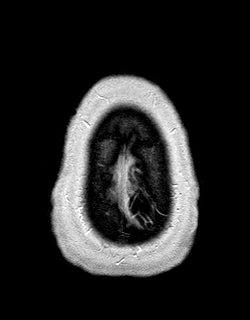
[im 160/160]
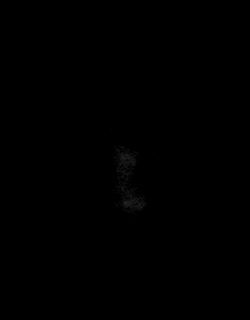

[48 of 48 positions shown; findings below may reference images not displayed]

FINDINGS: Brain: There is no evidence of acute infarct, intracranial
hemorrhage, mass, midline shift, or extra-axial fluid collection.
The ventricles and sulci are normal the brain is normal in signal.
No abnormal enhancement is identified. There is a moderately
expanded partially empty sella.

Vascular: Major intracranial vascular flow voids are preserved.

Skull and upper cervical spine: Unremarkable bone marrow signal.

Sinuses/Orbits: Mild tortuosity of the intraorbital optic nerves
with slightly prominent CSF within both optic nerve sheaths.
Paranasal sinuses and mastoid air cells are clear.

Other: None.
IMPRESSION: 1. Partially empty sella with mildly tortuous and slightly dilated
optic nerve sheaths which can be seen with idiopathic intracranial
hypertension. Consider correlation with lumbar puncture opening
pressure.
2. Otherwise unremarkable appearance of the brain.
